# Patient Record
Sex: Male | Born: 1945 | Race: Black or African American | Hispanic: No | Marital: Married | Smoking: Former smoker
Health system: Southern US, Community
[De-identification: ages and names within clinical notes are randomized; demographics above are authoritative.]

## PROBLEM LIST (undated history)

## (undated) DIAGNOSIS — E119 Type 2 diabetes mellitus without complications: Secondary | ICD-10-CM

## (undated) DIAGNOSIS — I10 Essential (primary) hypertension: Secondary | ICD-10-CM

## (undated) HISTORY — PX: JOINT REPLACEMENT: SHX530

## (undated) HISTORY — PX: EYE SURGERY: SHX253

## (undated) HISTORY — PX: CHOLECYSTECTOMY: SHX55

---

## 2017-03-23 ENCOUNTER — Emergency Department (HOSPITAL_COMMUNITY)
Admission: EM | Admit: 2017-03-23 | Discharge: 2017-03-24 | Disposition: A | Discharge status: Home / Self Care | Payer: Medicare Other | Attending: Emergency Medicine | Admitting: Emergency Medicine

## 2017-03-23 ENCOUNTER — Emergency Department (HOSPITAL_COMMUNITY): Payer: Medicare Other

## 2017-03-23 ENCOUNTER — Encounter (HOSPITAL_COMMUNITY): Payer: Self-pay | Admitting: Oncology

## 2017-03-23 DIAGNOSIS — Z79899 Other long term (current) drug therapy: Secondary | ICD-10-CM | POA: Insufficient documentation

## 2017-03-23 DIAGNOSIS — S0990XA Unspecified injury of head, initial encounter: Secondary | ICD-10-CM | POA: Diagnosis not present

## 2017-03-23 DIAGNOSIS — Z7982 Long term (current) use of aspirin: Secondary | ICD-10-CM | POA: Insufficient documentation

## 2017-03-23 DIAGNOSIS — Y999 Unspecified external cause status: Secondary | ICD-10-CM | POA: Insufficient documentation

## 2017-03-23 DIAGNOSIS — Z794 Long term (current) use of insulin: Secondary | ICD-10-CM | POA: Insufficient documentation

## 2017-03-23 DIAGNOSIS — Z87891 Personal history of nicotine dependence: Secondary | ICD-10-CM | POA: Insufficient documentation

## 2017-03-23 DIAGNOSIS — W19XXXA Unspecified fall, initial encounter: Secondary | ICD-10-CM

## 2017-03-23 DIAGNOSIS — E119 Type 2 diabetes mellitus without complications: Secondary | ICD-10-CM | POA: Diagnosis not present

## 2017-03-23 DIAGNOSIS — Y929 Unspecified place or not applicable: Secondary | ICD-10-CM | POA: Diagnosis not present

## 2017-03-23 DIAGNOSIS — I10 Essential (primary) hypertension: Secondary | ICD-10-CM | POA: Diagnosis not present

## 2017-03-23 DIAGNOSIS — S20211A Contusion of right front wall of thorax, initial encounter: Secondary | ICD-10-CM

## 2017-03-23 DIAGNOSIS — Z96641 Presence of right artificial hip joint: Secondary | ICD-10-CM | POA: Diagnosis not present

## 2017-03-23 DIAGNOSIS — Y9389 Activity, other specified: Secondary | ICD-10-CM | POA: Insufficient documentation

## 2017-03-23 DIAGNOSIS — S299XXA Unspecified injury of thorax, initial encounter: Secondary | ICD-10-CM | POA: Diagnosis present

## 2017-03-23 HISTORY — DX: Essential (primary) hypertension: I10

## 2017-03-23 HISTORY — DX: Type 2 diabetes mellitus without complications: E11.9

## 2017-03-23 LAB — COMPREHENSIVE METABOLIC PANEL
ALT: 24 U/L (ref 17–63)
AST: 25 U/L (ref 15–41)
Albumin: 3.9 g/dL (ref 3.5–5.0)
Alkaline Phosphatase: 70 U/L (ref 38–126)
Anion gap: 8 (ref 5–15)
BUN: 21 mg/dL — ABNORMAL HIGH (ref 6–20)
CHLORIDE: 107 mmol/L (ref 101–111)
CO2: 22 mmol/L (ref 22–32)
CREATININE: 1.71 mg/dL — AB (ref 0.61–1.24)
Calcium: 9.3 mg/dL (ref 8.9–10.3)
GFR calc non Af Amer: 38 mL/min — ABNORMAL LOW (ref >60)
GFR, EST AFRICAN AMERICAN: 44 mL/min — AB (ref >60)
Glucose, Bld: 203 mg/dL — ABNORMAL HIGH (ref 65–99)
POTASSIUM: 4.1 mmol/L (ref 3.5–5.1)
SODIUM: 137 mmol/L (ref 135–145)
Total Bilirubin: 0.4 mg/dL (ref 0.3–1.2)
Total Protein: 6.9 g/dL (ref 6.5–8.1)

## 2017-03-23 LAB — CBC WITH DIFFERENTIAL/PLATELET
BASOS ABS: 0 10*3/uL (ref 0.0–0.1)
Basophils Relative: 0 %
EOS ABS: 0.1 10*3/uL (ref 0.0–0.7)
EOS PCT: 3 %
HCT: 33.7 % — ABNORMAL LOW (ref 39.0–52.0)
Hemoglobin: 11.6 g/dL — ABNORMAL LOW (ref 13.0–17.0)
LYMPHS ABS: 1.3 10*3/uL (ref 0.7–4.0)
Lymphocytes Relative: 27 %
MCH: 30.3 pg (ref 26.0–34.0)
MCHC: 34.4 g/dL (ref 30.0–36.0)
MCV: 88 fL (ref 78.0–100.0)
MONO ABS: 0.3 10*3/uL (ref 0.1–1.0)
Monocytes Relative: 6 %
Neutro Abs: 3 10*3/uL (ref 1.7–7.7)
Neutrophils Relative %: 64 %
PLATELETS: 170 10*3/uL (ref 150–400)
RBC: 3.83 MIL/uL — AB (ref 4.22–5.81)
RDW: 13.8 % (ref 11.5–15.5)
WBC: 4.7 10*3/uL (ref 4.0–10.5)

## 2017-03-23 MED ORDER — HYDROMORPHONE HCL 1 MG/ML IJ SOLN
1.0000 mg | Freq: Once | INTRAMUSCULAR | Status: AC
  Administered 2017-03-23: 1 mg via INTRAVENOUS
  Filled 2017-03-23: qty 1

## 2017-03-23 MED ORDER — FENTANYL CITRATE (PF) 100 MCG/2ML IJ SOLN
50.0000 ug | Freq: Once | INTRAMUSCULAR | Status: AC
  Administered 2017-03-23: 50 ug via INTRAVENOUS
  Filled 2017-03-23: qty 2

## 2017-03-23 NOTE — ED Notes (Signed)
Patient transported to CT 

## 2017-03-23 NOTE — ED Provider Notes (Addendum)
[facility]ENC[facility] Provider Note   CSN: [id number] Arrival date & time: 03/23/17  2009     History   Chief Complaint Chief Complaint  Patient presents with  . Fall    right rib pain    HPI Martin Wilson is a 71 y.o. male.  HPI 71 year old male with history of diabetes and hypertension comes in after a fall.  Patient is not on any blood thinners.  Patient reports that he was helping move his his child, and was sitting on the bed of the truck.  Patient had a mechanical fall from 4 feet height and fell onto his back.  Patient is having right-sided chest pain since the fall, with worsening of the pain with inspiration.  Patient also reports hitting his head and having mild headache.  Patient denies any numbness, tingling, vision change, focal weakness.  Patient is not on any blood thinners.  Patient denies any neck pain.  Past Medical History:  Diagnosis Date  . Diabetes mellitus without complication (HCC)   . Hypertension     There are no active problems to display for this patient.   Past Surgical History:  Procedure Laterality Date  . CHOLECYSTECTOMY    . EYE SURGERY Left    Prosthetic  . JOINT REPLACEMENT Right    Hip       Home Medications    Prior to Admission medications   Medication Sig Start Date End Date Taking? Authorizing Provider  aspirin EC 325 MG tablet Take 325 mg by mouth daily.   Yes [provider]  Aspirin-Salicylamide-Caffeine (BC HEADACHE POWDER PO) Take 1 packet by mouth 2 (two) times daily as needed (pain/headache).   Yes [provider]  buPROPion (WELLBUTRIN SR) 150 MG 12 hr tablet Take 150 mg by mouth daily.   Yes [provider]  diphenhydrAMINE (BENADRYL) 25 MG tablet Take 25 mg by mouth daily as needed for allergies.   Yes [provider]  insulin glargine (LANTUS) 100 UNIT/ML injection Inject 34 Units into the skin at bedtime.   Yes [provider]  latanoprost  (XALATAN) 0.005 % ophthalmic solution Place 1 drop into the right eye at bedtime.   Yes [provider]  lisinopril (PRINIVIL,ZESTRIL) 40 MG tablet Take 40 mg by mouth 2 (two) times daily.   Yes [provider]  Omega-3 Fatty Acids (FISH OIL PO) Take 4 capsules by mouth daily.   Yes [provider]  acetaminophen (TYLENOL) 325 MG tablet Take 2 tablets (650 mg total) by mouth every 6 (six) hours as needed. 03/24/17   Derwood KaplanNanavati, Aidan Caloca, MD  HYDROcodone-acetaminophen (NORCO/VICODIN) 5-325 MG tablet Take 1 tablet by mouth every 8 (eight) hours as needed for severe pain. 03/24/17   Derwood KaplanNanavati, Rosita Guzzetta, MD  methocarbamol (ROBAXIN) 500 MG tablet Take 1 tablet (500 mg total) by mouth 2 (two) times daily. 03/24/17   Derwood KaplanNanavati, Brennyn Haisley, MD  naproxen (NAPROSYN) 375 MG tablet Take 1 tablet (375 mg total) by mouth 2 (two) times daily. 03/24/17   Derwood KaplanNanavati, Jordan Caraveo, MD    Family History No family history on file.  Social History Social History   Tobacco Use  . Smoking status: Former Games developermoker  . Smokeless tobacco: Never Used  Substance Use Topics  . Alcohol use: No    Frequency: Never  . Drug use: No     Allergies   Patient has no known allergies.   Review of Systems Review of Systems  Constitutional: Negative for activity change.  Respiratory: Negative for shortness of breath.   Cardiovascular: Positive for chest pain.  Musculoskeletal: Positive for arthralgias and back pain.  Neurological: Positive for headaches.  Hematological: Does not bruise/bleed easily.  All other systems reviewed and are negative.    Physical Exam Updated Vital Signs BP (!) 149/87   Pulse (!) 105   Temp 97.6 F (36.4 C) (Oral)   Resp 12   Ht 5' 9.5" (1.765 m)   Wt 82.1 kg (181 lb)   SpO2 100%   BMI 26.35 kg/m   Physical Exam  Constitutional: He is oriented to person, place, and time. He appears well-developed.  HENT:  Head: Atraumatic.  Neck: Neck supple.  No midline c-spine  tenderness, pt able to turn head to 45 degrees bilaterally without any pain and able to flex neck to the chest and extend without any pain or neurologic symptoms.   Cardiovascular: Normal rate.  Pulmonary/Chest: Effort normal.  Musculoskeletal:  Head to toe evaluation shows no hematoma, bleeding of the scalp, no facial abrasions, no spine step offs, crepitus of the chest or neck, no tenderness to palpation of the bilateral upper and lower extremities, no gross deformities, no chest tenderness, no pelvic pain.   Neurological: He is alert and oriented to person, place, and time.  Skin: Skin is warm.  Nursing note and vitals reviewed.    ED Treatments / Results  Labs (all labs ordered are listed, but only abnormal results are displayed) Labs Reviewed  CBC WITH DIFFERENTIAL/PLATELET - Abnormal; Notable for the following components:      Result Value   RBC 3.83 (*)    Hemoglobin 11.6 (*)    HCT 33.7 (*)    All other components within normal limits  COMPREHENSIVE METABOLIC PANEL - Abnormal; Notable for the following components:   Glucose, Bld 203 (*)    BUN 21 (*)    Creatinine, Ser 1.71 (*)    GFR calc non Af Amer 38 (*)    GFR calc Af Amer 44 (*)    All other components within normal limits    EKG  EKG Interpretation None       Radiology Dg Ribs Unilateral W/chest Right  Result Date: 03/23/2017 CLINICAL DATA:  Patient fell off while moving trapped landing on right side. Right rib pain. EXAM: RIGHT RIBS AND CHEST - 3+ VIEW COMPARISON:  None. FINDINGS: No fracture or other bone lesions are seen involving the ribs. There is no evidence of pneumothorax or pleural effusion. Both lungs are clear. Heart size and mediastinal contours are within normal limits. Elevation of right hemidiaphragm is noted. The patient is status post cholecystectomy. IMPRESSION: No acute rib fracture nor acute pulmonary abnormality. Electronically Signed   By: Tollie Eth M.D.   On: 03/23/2017 23:52   Ct  Head Wo Contrast  Result Date: 03/23/2017 CLINICAL DATA:  Fall off moving track.  No loss of consciousness. EXAM: CT HEAD WITHOUT CONTRAST TECHNIQUE: Contiguous axial images were obtained from the base of the skull through the vertex without intravenous contrast. COMPARISON:  None. FINDINGS: Brain: Age related atrophy. No intracranial hemorrhage, mass effect, or midline shift. No hydrocephalus. The basilar cisterns are patent. No evidence of territorial infarct or acute ischemia. No extra-axial or intracranial fluid collection. Vascular: Atherosclerosis of skullbase vasculature without hyperdense vessel or abnormal calcification. Skull: No fracture or focal lesion. Sinuses/Orbits: No acute abnormality.  Left globe prosthesis. Other: None. IMPRESSION: No acute intracranial abnormality.  No skull fracture. Electronically Signed   By: Shawna Orleans  Ehinger M.D.   On: 03/23/2017 23:34   Dg Hip Unilat W Or Wo Pelvis 2-3 Views Right  Result Date: 03/23/2017 CLINICAL DATA:  Right hip pain after fall from a moving truck. EXAM: DG HIP (WITH OR WITHOUT PELVIS) 2-3V RIGHT COMPARISON:  None. FINDINGS: Lower lumbar degenerative disc disease at L4-5 with vacuum disc phenomenon. The bony pelvis appears intact. Surgical clips project over expected location of the prostate. The patient has an uncemented right total hip arthroplasty with metallic reinforcement along the iliac bone. Tiny punctate metallic densities are seen along the infero medial aspect of the hip joint that could represent stigmata of particle wear. No acute fracture or frank bone destruction is seen. IMPRESSION: 1. Lower lumbar degenerative disc disease. 2. No acute pelvic or hip fracture. 3. Punctate metallic densities adjacent to a pre-existing hip arthroplasty with metallic reinforcement of the acetabulum are identified. Particle wear might account for this. Electronically Signed   By: Tollie Ethavid  Kwon M.D.   On: 03/23/2017 23:56    Procedures Procedures  (including critical care time)  Medications Ordered in ED Medications  methocarbamol (ROBAXIN) 1,000 mg in dextrose 5 % 50 mL IVPB (not administered)  fentaNYL (SUBLIMAZE) injection 50 mcg (50 mcg Intravenous Given 03/23/17 2159)  HYDROmorphone (DILAUDID) injection 1 mg (1 mg Intravenous Given 03/23/17 2232)  ketorolac (TORADOL) 15 MG/ML injection 15 mg (15 mg Intravenous Given 03/24/17 0020)     Initial Impression / Assessment and Plan / ED Course  I have reviewed the triage vital signs and the nursing notes.  Pertinent labs & imaging results that were available during my care of the patient were reviewed by me and considered in my medical decision making (see chart for details).     Patient comes in with chief complaint of fall. CT scan of the head ordered due to reported head trauma with mild headache.  Patient has no focal neurologic deficits.  C-spine has been cleared clinically. X-ray of the ribs, and pelvis have been ordered.  On exam patient is noted to have right sided chest tenderness, and right hip tenderness.  There is no evidence of abdominal cysts or peritoneal signs.  Final Clinical Impressions(s) / ED Diagnoses   Final diagnoses:  Rib contusion, right, initial encounter  Fall, initial encounter    ED Discharge Orders        Ordered    naproxen (NAPROSYN) 375 MG tablet  2 times daily     03/24/17 0045    methocarbamol (ROBAXIN) 500 MG tablet  2 times daily     03/24/17 0045    HYDROcodone-acetaminophen (NORCO/VICODIN) 5-325 MG tablet  Every 8 hours PRN     03/24/17 0045    acetaminophen (TYLENOL) 325 MG tablet  Every 6 hours PRN     03/24/17 0045       Derwood KaplanNanavati, Kathya Wilz, MD 03/23/17 2352    Derwood KaplanNanavati, Kassidi Elza, MD 03/24/17 [phone]

## 2017-03-23 NOTE — ED Triage Notes (Signed)
Pt bib [facility] d/t mechanical fall off of the lift gate of a moving truck.  Pt fell on his right side causing right ribcage and right hip pain.  Pt denies dizziness prior to fall.  Denies LOC.

## 2017-03-23 NOTE — ED Notes (Signed)
HR of 10 PTA was entered in error.  Per EMS pt's HR was 100.

## 2017-03-24 MED ORDER — METHOCARBAMOL 1000 MG/10ML IJ SOLN: 1000.0000 mg | Freq: Once | INTRAMUSCULAR | Status: DC

## 2017-03-24 MED ORDER — METHOCARBAMOL 500 MG PO TABS: 500.0000 mg | ORAL_TABLET | Freq: Two times a day (BID) | ORAL | 0 refills | Status: AC

## 2017-03-24 MED ORDER — METHOCARBAMOL 1000 MG/10ML IJ SOLN
1000.0000 mg | Freq: Once | INTRAVENOUS | Status: AC
  Administered 2017-03-24: 1000 mg via INTRAVENOUS
  Filled 2017-03-24: qty 10

## 2017-03-24 MED ORDER — NAPROXEN 375 MG PO TABS: 375.0000 mg | ORAL_TABLET | Freq: Two times a day (BID) | ORAL | 0 refills | Status: AC

## 2017-03-24 MED ORDER — HYDROCODONE-ACETAMINOPHEN 5-325 MG PO TABS: 1.0000 | ORAL_TABLET | Freq: Three times a day (TID) | ORAL | 0 refills | Status: AC | PRN

## 2017-03-24 MED ORDER — ACETAMINOPHEN 325 MG PO TABS: 650.0000 mg | ORAL_TABLET | Freq: Four times a day (QID) | ORAL | 0 refills | Status: AC | PRN

## 2017-03-24 MED ORDER — KETOROLAC TROMETHAMINE 15 MG/ML IJ SOLN
15.0000 mg | Freq: Once | INTRAMUSCULAR | Status: AC
  Administered 2017-03-24: 15 mg via INTRAVENOUS
  Filled 2017-03-24: qty 1

## 2017-03-24 NOTE — Discharge Instructions (Signed)
We suspect that you have rib contusions. Please take the medications prescribed  Please use the incentive spirometer as instructed to prevent pneumonia. Apply cold compresses intermittently as needed.  As pain recedes, begin normal activities slowly as tolerated.

## 2018-11-23 IMAGING — DX DG RIBS W/ CHEST 3+V*R*
5 series · 5 of 5 positions shown · non-contrast
Comparison: None.

CLINICAL DATA: Patient fell off while moving trapped landing on
right side. Right rib pain.

EXAM:
RIGHT RIBS AND CHEST - 3+ VIEW

[chest ap]
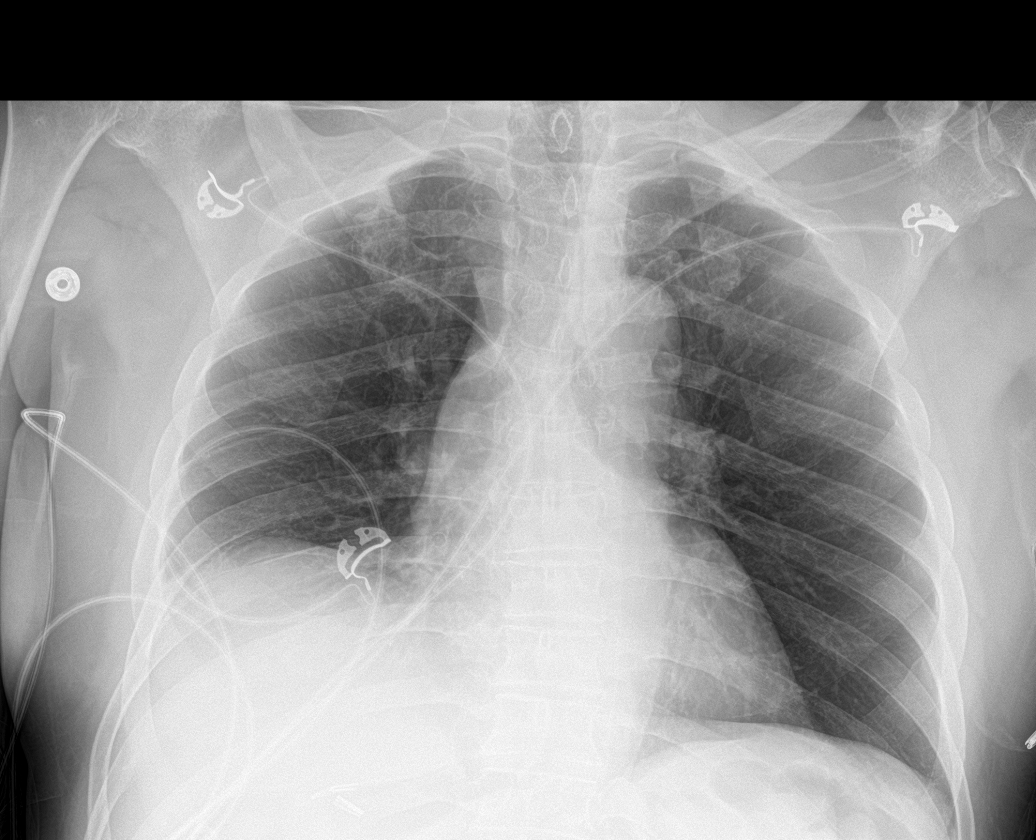

[rib ap (1 of 2)]
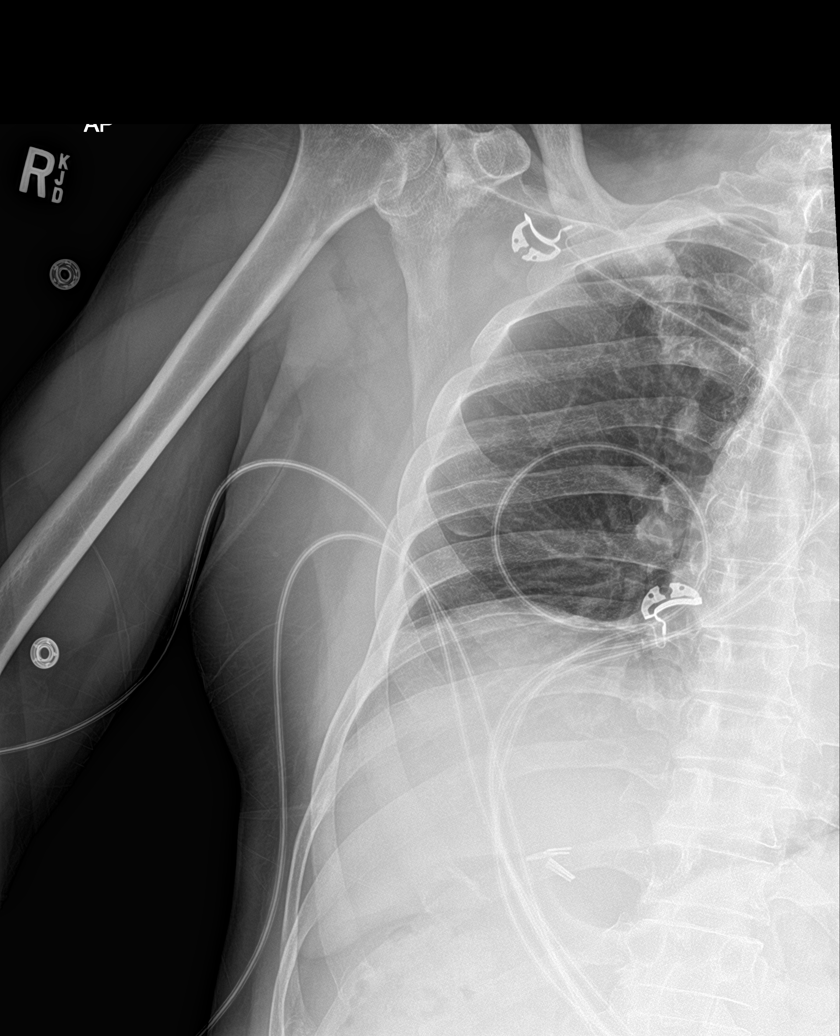

[rib ap obl (1 of 2)]
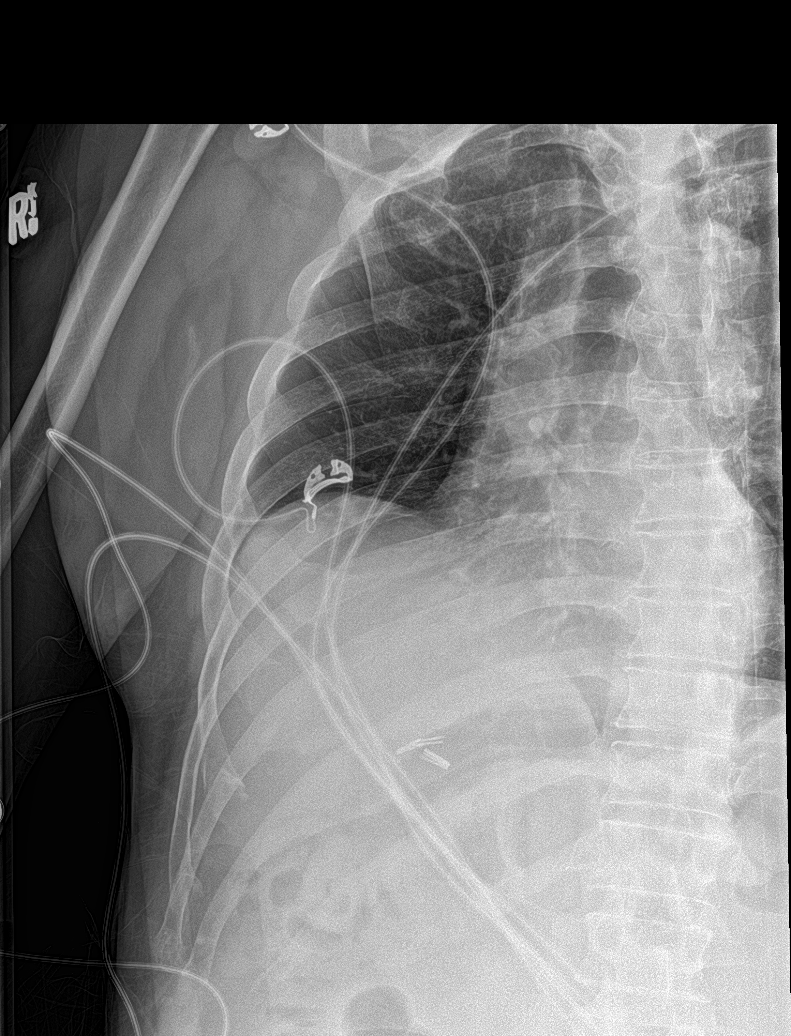

[rib ap (2 of 2)]
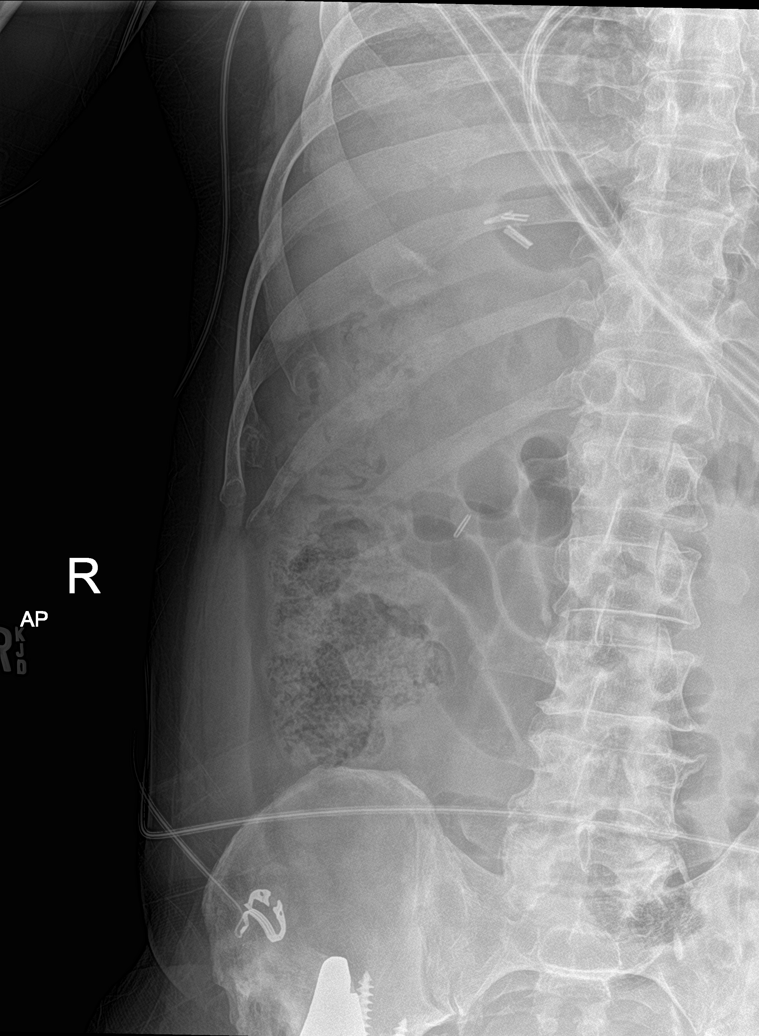

[rib ap obl (2 of 2)]
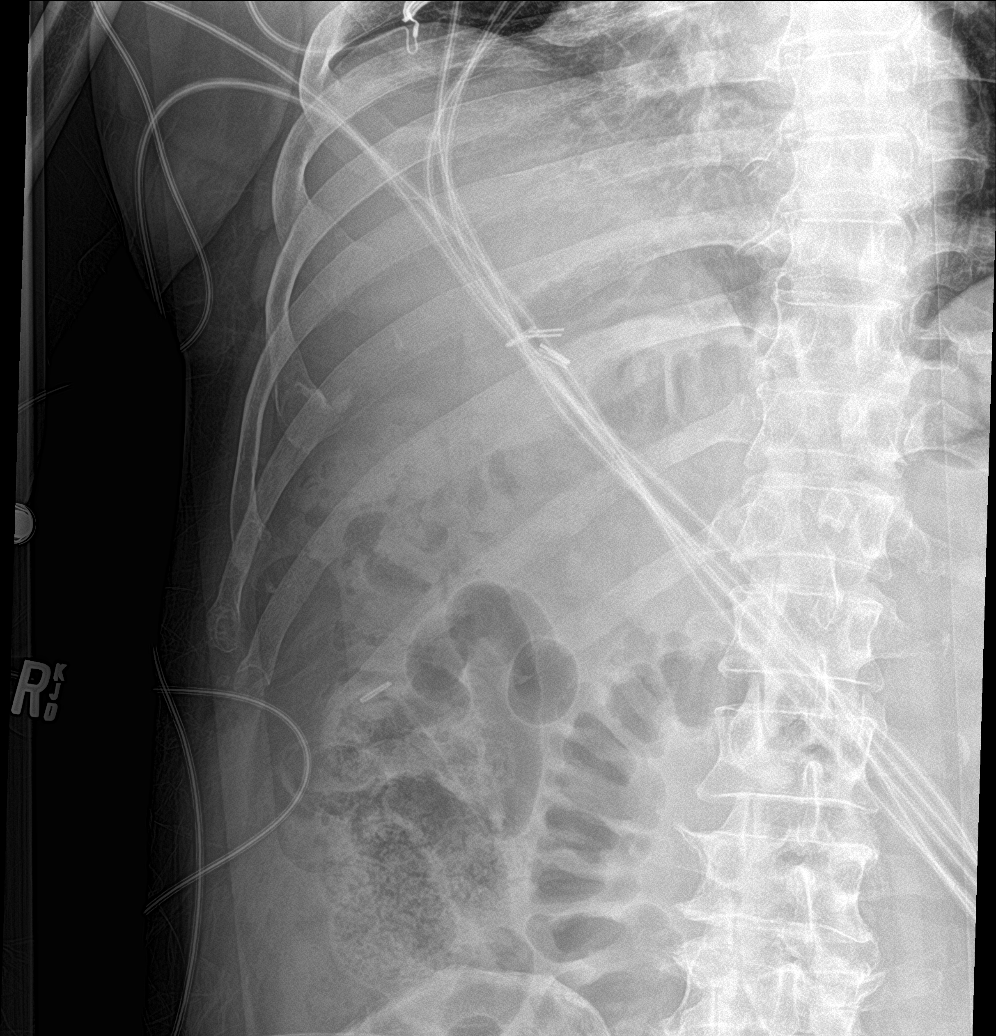

[5 of 5 positions shown; findings below may reference images not displayed]

FINDINGS: No fracture or other bone lesions are seen involving the ribs. There
is no evidence of pneumothorax or pleural effusion. Both lungs are
clear. Heart size and mediastinal contours are within normal limits.
Elevation of right hemidiaphragm is noted. The patient is status
post cholecystectomy.
IMPRESSION: No acute rib fracture nor acute pulmonary abnormality.

## 2018-11-23 IMAGING — CT CT HEAD W/O CM
3 of 4 series · 13 of 47 positions shown, 15 images · non-contrast
Comparison: None.

CLINICAL DATA: Fall off moving track.  No loss of consciousness.

EXAM:
CT HEAD WITHOUT CONTRAST
TECHNIQUE: Contiguous axial images were obtained from the base of the skull
through the vertex without intravenous contrast.

[Series 3: head wo · axial · 0.45mm/px · z∈[-132,-22]mm · 7 of 30 slices shown, 9 images]
[im 4/30  brain]
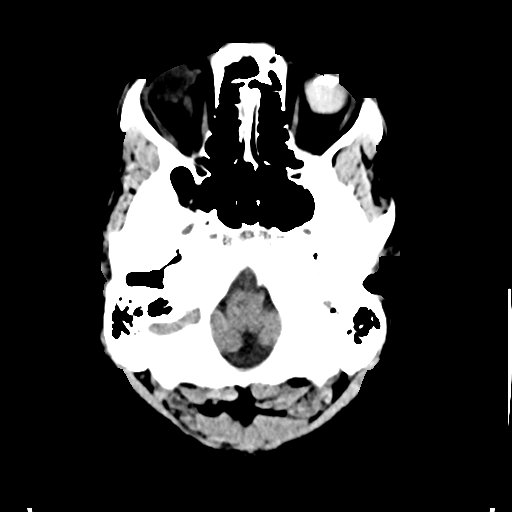
[im 4/30  bone]
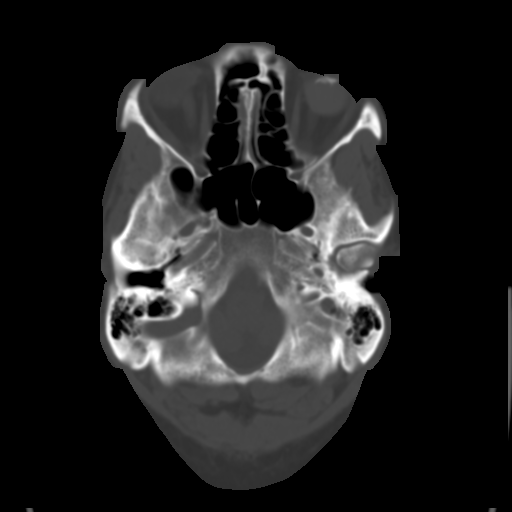
[im 8/30  brain]
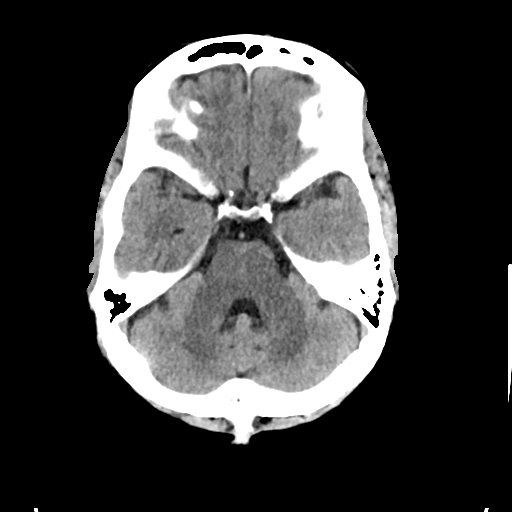
[im 11/30  brain]
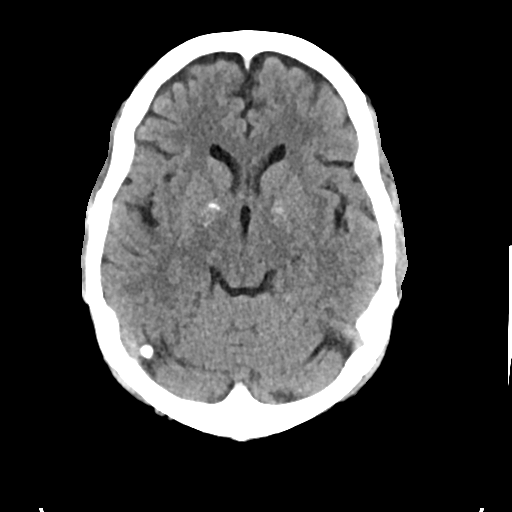
[im 15/30  brain]
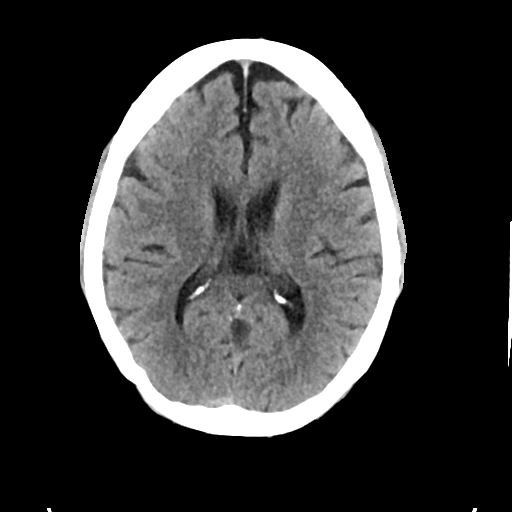
[im 19/30  brain]
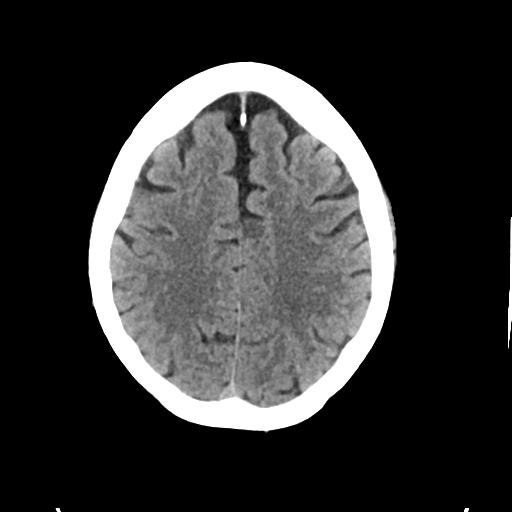
[im 19/30  bone]
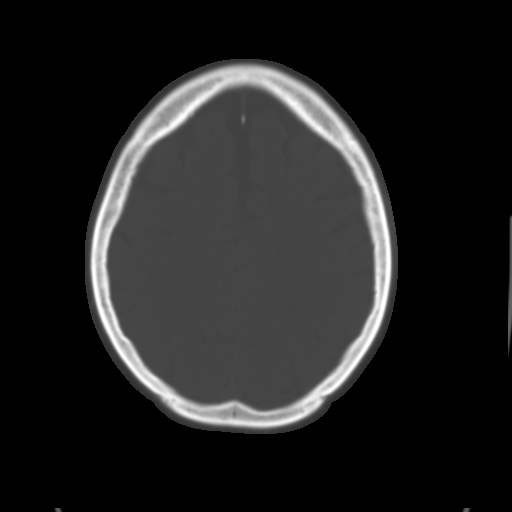
[im 22/30  brain]
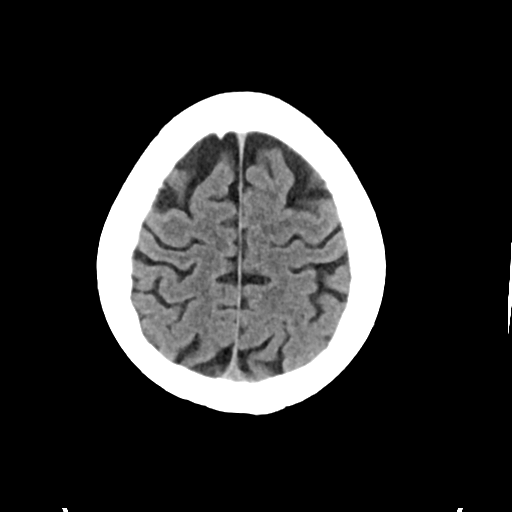
[im 26/30  brain]
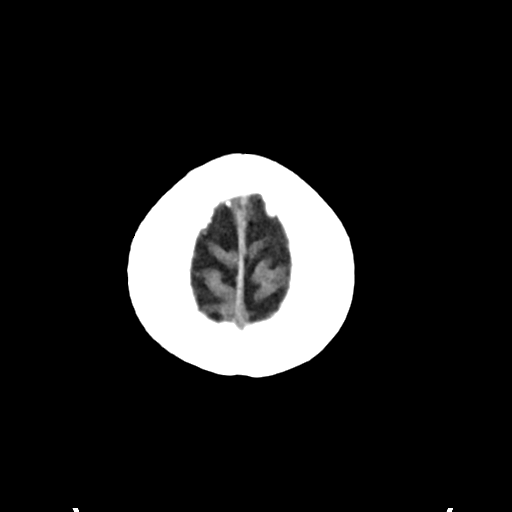

[Series 5: cor soft · coronal · 0.29mm/px · 3 of 62 slices shown]
[im 21/62  brain]
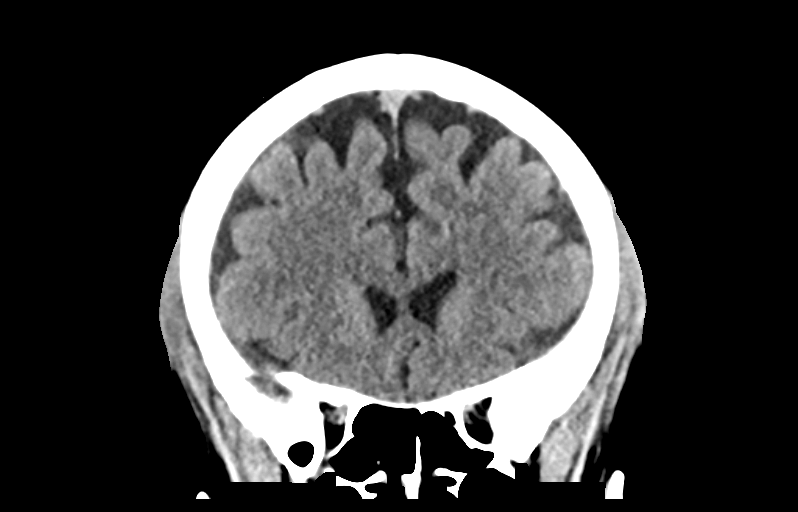
[im 28/62  brain]
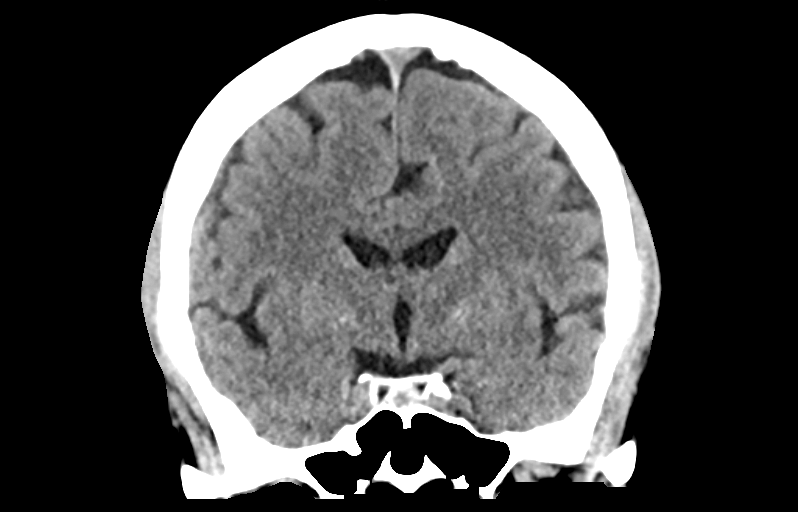
[im 34/62  brain]
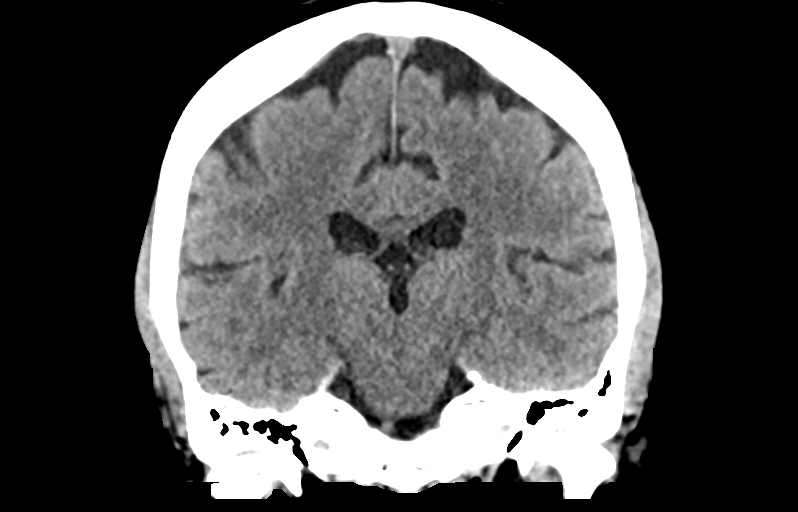

[Series 6: sag soft · sagittal · 0.29mm/px · 3 of 48 slices shown]
[im 16/48  brain]
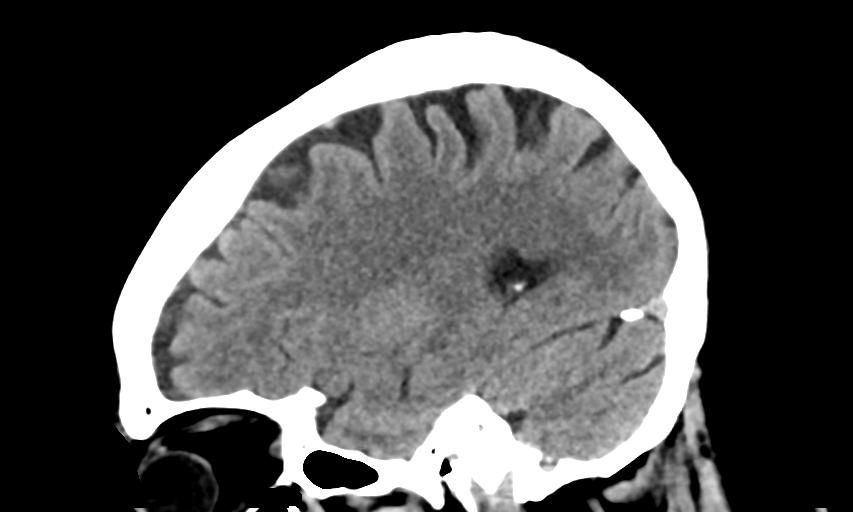
[im 24/48  brain]
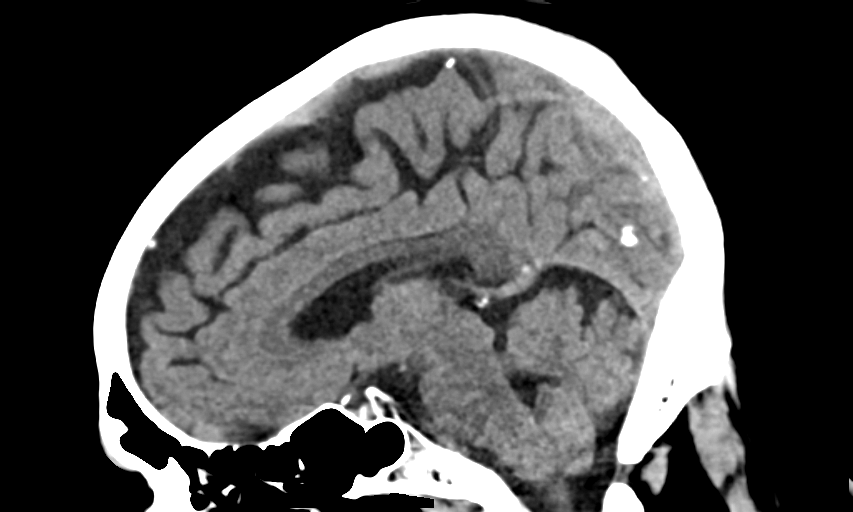
[im 32/48  brain]
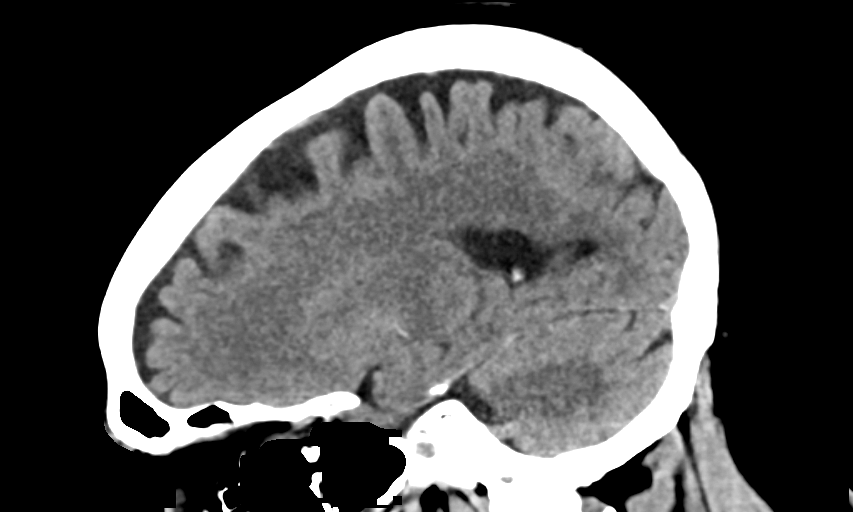

[13 of 47 positions shown; findings below may reference images not displayed]

FINDINGS: Brain: Age related atrophy. No intracranial hemorrhage, mass effect,
or midline shift. No hydrocephalus. The basilar cisterns are patent.
No evidence of territorial infarct or acute ischemia. No extra-axial
or intracranial fluid collection.

Vascular: Atherosclerosis of skullbase vasculature without
hyperdense vessel or abnormal calcification.

Skull: No fracture or focal lesion.

Sinuses/Orbits: No acute abnormality.  Left globe prosthesis.

Other: None.
IMPRESSION: No acute intracranial abnormality.  No skull fracture.

## 2019-05-12 DEATH — deceased
